# Patient Record
Sex: Male | Born: 1980 | Race: White | Hispanic: Yes | Marital: Married | Smoking: Never smoker
Health system: Southern US, Community
[De-identification: ages and names within clinical notes are randomized; demographics above are authoritative.]

## PROBLEM LIST (undated history)

## (undated) ENCOUNTER — Ambulatory Visit (HOSPITAL_COMMUNITY): Admission: EM | Payer: Self-pay

## (undated) DIAGNOSIS — Z789 Other specified health status: Secondary | ICD-10-CM

## (undated) HISTORY — DX: Other specified health status: Z78.9

---

## 2007-11-13 ENCOUNTER — Emergency Department (HOSPITAL_COMMUNITY): Admission: EM | Admit: 2007-11-13 | Discharge: 2007-11-13 | Payer: Self-pay | Admitting: Emergency Medicine

## 2011-03-31 ENCOUNTER — Encounter (HOSPITAL_COMMUNITY): Payer: Self-pay

## 2011-03-31 ENCOUNTER — Emergency Department (HOSPITAL_COMMUNITY): Payer: No Typology Code available for payment source

## 2011-03-31 ENCOUNTER — Emergency Department (HOSPITAL_COMMUNITY)
Admission: EM | Admit: 2011-03-31 | Discharge: 2011-03-31 | Disposition: A | Discharge status: Home / Self Care | Payer: No Typology Code available for payment source | Attending: Emergency Medicine | Admitting: Emergency Medicine

## 2011-03-31 DIAGNOSIS — S139XXA Sprain of joints and ligaments of unspecified parts of neck, initial encounter: Secondary | ICD-10-CM | POA: Insufficient documentation

## 2011-03-31 DIAGNOSIS — S161XXA Strain of muscle, fascia and tendon at neck level, initial encounter: Secondary | ICD-10-CM

## 2011-03-31 DIAGNOSIS — M542 Cervicalgia: Secondary | ICD-10-CM | POA: Insufficient documentation

## 2011-03-31 MED ORDER — OXYCODONE-ACETAMINOPHEN 5-325 MG PO TABS: 1.0000 | ORAL_TABLET | ORAL | Status: AC | PRN

## 2011-03-31 MED ORDER — NAPROXEN 500 MG PO TABS: 500.0000 mg | ORAL_TABLET | Freq: Two times a day (BID) | ORAL | Status: AC

## 2011-03-31 NOTE — ED Notes (Signed)
Patient transported to CT 
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
## 2013-10-05 ENCOUNTER — Ambulatory Visit: Payer: Self-pay

## 2014-05-24 ENCOUNTER — Ambulatory Visit: Payer: Self-pay | Attending: Internal Medicine

## 2015-05-27 ENCOUNTER — Ambulatory Visit: Payer: Self-pay | Attending: Internal Medicine

## 2017-07-02 ENCOUNTER — Ambulatory Visit (INDEPENDENT_AMBULATORY_CARE_PROVIDER_SITE_OTHER): Payer: Self-pay

## 2017-07-02 ENCOUNTER — Encounter (HOSPITAL_COMMUNITY): Payer: Self-pay | Admitting: Emergency Medicine

## 2017-07-02 ENCOUNTER — Ambulatory Visit (HOSPITAL_COMMUNITY)
Admission: EM | Admit: 2017-07-02 | Discharge: 2017-07-02 | Disposition: A | Discharge status: Home / Self Care | Payer: Self-pay | Attending: Family Medicine | Admitting: Family Medicine

## 2017-07-02 DIAGNOSIS — B349 Viral infection, unspecified: Secondary | ICD-10-CM

## 2017-07-02 MED ORDER — PREDNISONE 20 MG PO TABS: 40.0000 mg | ORAL_TABLET | Freq: Every day | ORAL | 0 refills | Status: AC

## 2017-07-02 NOTE — ED Provider Notes (Signed)
  [facility]    CSN: [id number] Arrival date & time: 07/02/17  1745   Chief Complaint  Patient presents with  . Fever    Geneva A Mendez-Agustin here for URI complaints. Here with wife who helps interpret [place].   Duration: 3 days  Associated symptoms: HA, muscle aches, knee pain, fevers up to 101 F Denies: sinus congestion, sinus pain, rhinorrhea, itchy watery eyes, ear pain, ear drainage, sore throat, urinary complaints. wheezing, shortness of breath, GI s/s's, rashes Treatment to date: Tylenol, ibuprofen Sick contacts: No  ROS:  Const: +fevers HEENT: As noted in HPI Lungs: No SOB  No known medical problems.  Family History  Family history unknown: Yes   Takes no meds routinely.  No Known Allergies  BP 109/71   Pulse 99   Temp 98.8 F (37.1 C)   Resp 16   SpO2 100%  General: Awake, alert, appears stated age HEENT: AT, , ears patent b/l and TM's neg, nares patent w/o discharge, pharynx pink and without exudates, MMM Neck: No masses or asymmetry Heart: RRR Lungs: CTAB, no accessory muscle use Abd: BS+, soft, NT, ND, neg Murphy's and McBurney's MSK: No warmth over jts or erythema Skin: No rashes on exposed skin surfaces Psych: Age appropriate judgment and insight, normal mood and affect  Viral illness  Pred burst for myalgias and what sounds like synovitis, likely 2/2 viral illness.  CXR neg.  F/u prn. He is encouraged to find PCP.  Pt voiced understanding and agreement to the plan.    Sharlene DoryWendling, Tamarah Bhullar Paul, [place] 07/02/17 1943

## 2017-07-02 NOTE — ED Triage Notes (Signed)
Pt c/o fever since Wednesday night, with headaches, body aches, bilateral knee aches, denies injury. Ambulatory with steady gait.

## 2017-07-02 NOTE — Discharge Instructions (Signed)
OK to take Tylenol 1000 mg (2 extra strength tabs) or 975 mg (3 regular strength tabs) every 6 hours as needed.  Continue to push fluids, practice good hand hygiene, and cover your mouth if you cough.  If you start having increasing fevers, shaking or shortness of breath, seek immediate care.

## 2019-01-08 IMAGING — DX DG CHEST 2V
2 series · 2 of 2 positions shown · non-contrast
Comparison: None.

CLINICAL DATA: Fever

EXAM:
CHEST - 2 VIEW

[chest pa]
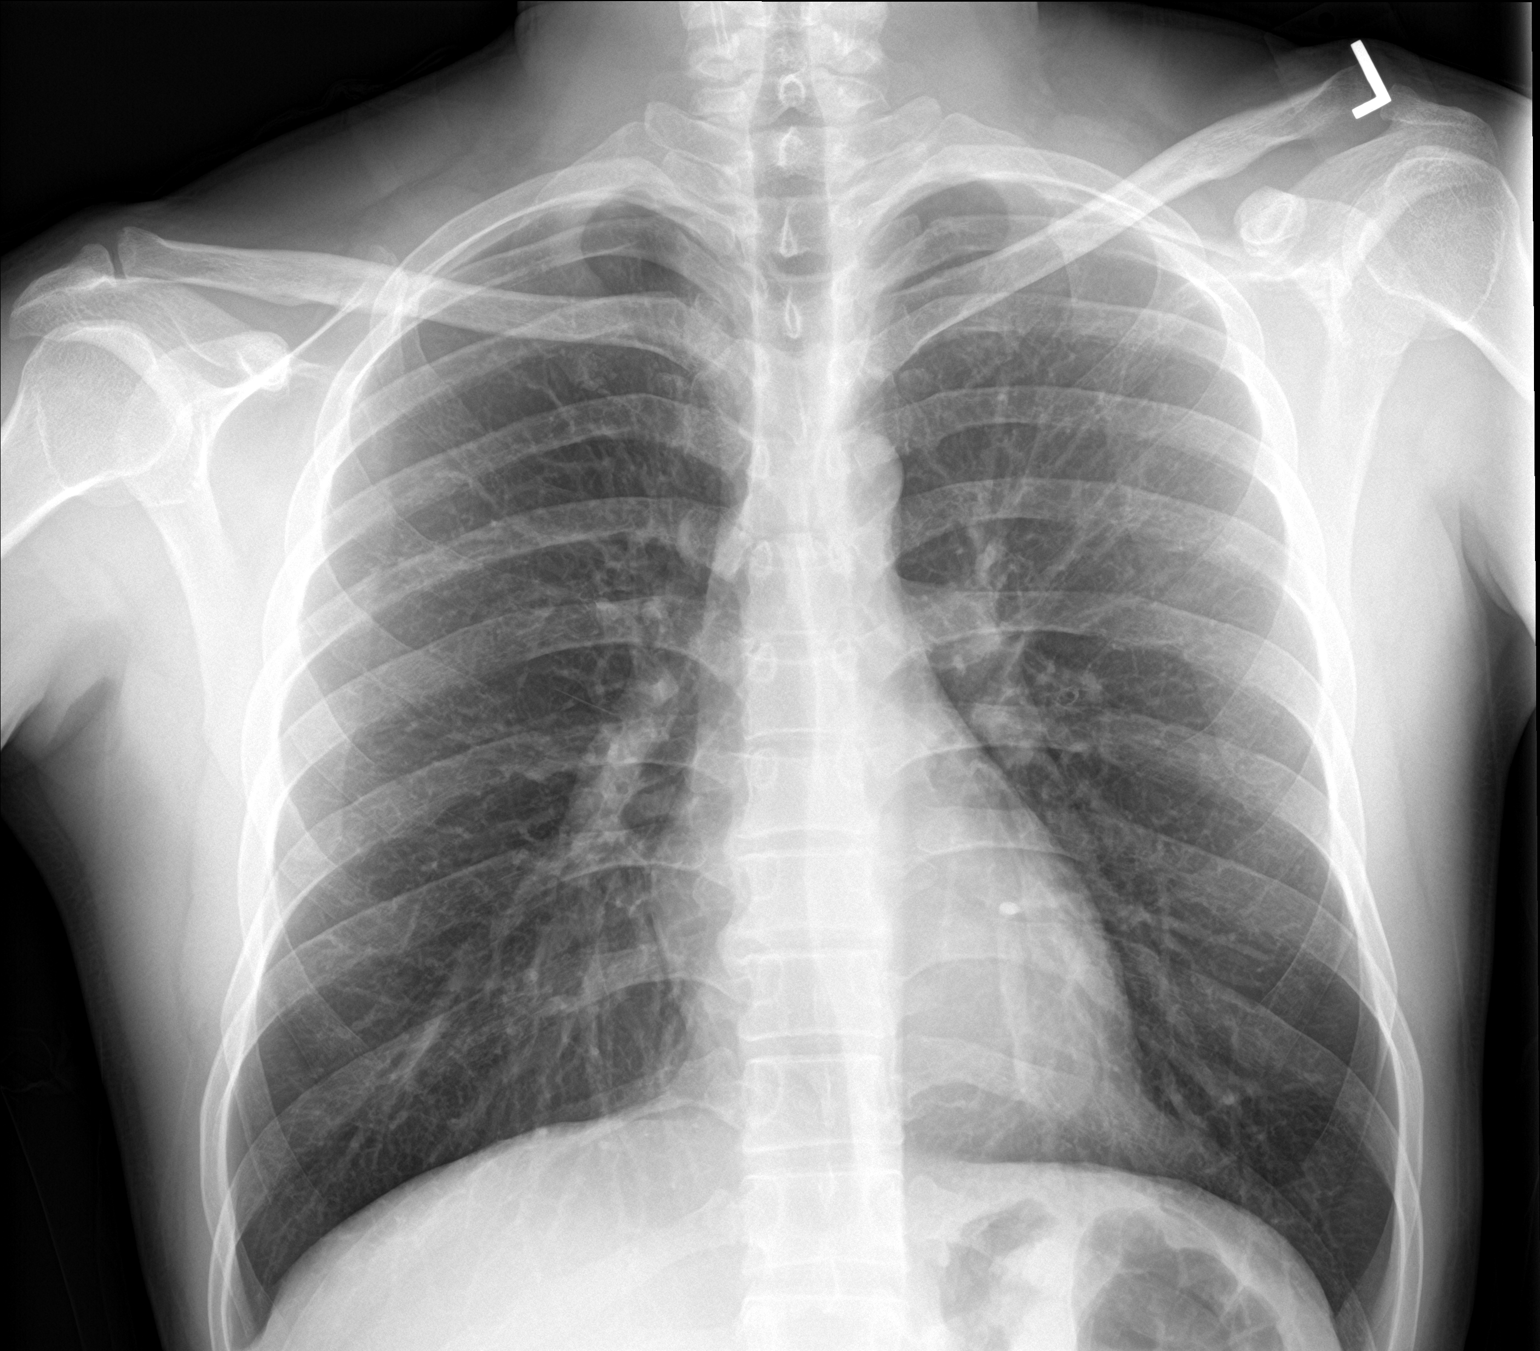

[chest lat]
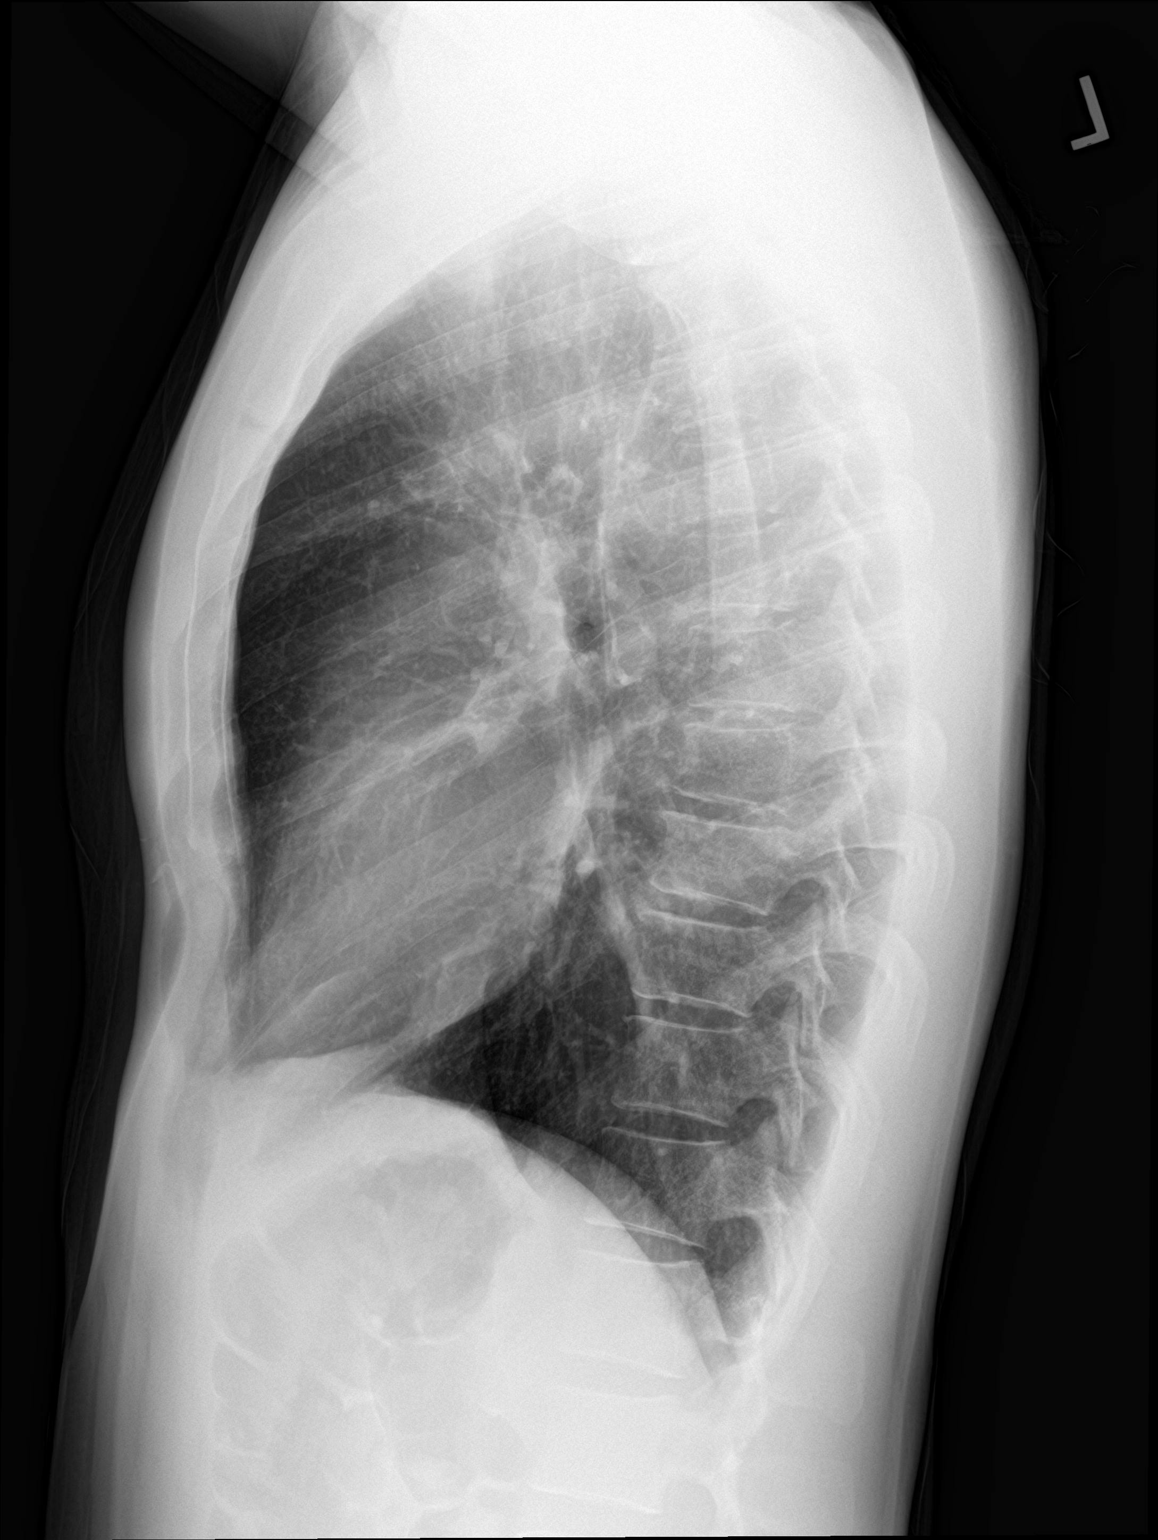

[2 of 2 positions shown; findings below may reference images not displayed]

FINDINGS: Heart and mediastinal contours are within normal limits. No focal
opacities or effusions. No acute bony abnormality.
IMPRESSION: No active cardiopulmonary disease.

## 2020-06-27 ENCOUNTER — Other Ambulatory Visit: Payer: Self-pay

## 2020-06-27 ENCOUNTER — Encounter (HOSPITAL_COMMUNITY): Payer: Self-pay

## 2020-06-27 ENCOUNTER — Ambulatory Visit (HOSPITAL_COMMUNITY)
Admission: EM | Admit: 2020-06-27 | Discharge: 2020-06-27 | Disposition: A | Discharge status: Home / Self Care | Payer: Self-pay | Attending: Physician Assistant | Admitting: Physician Assistant

## 2020-06-27 DIAGNOSIS — R059 Cough, unspecified: Secondary | ICD-10-CM

## 2020-06-27 DIAGNOSIS — J302 Other seasonal allergic rhinitis: Secondary | ICD-10-CM

## 2020-06-27 MED ORDER — ALBUTEROL SULFATE HFA 108 (90 BASE) MCG/ACT IN AERS: 1.0000 | INHALATION_SPRAY | Freq: Four times a day (QID) | RESPIRATORY_TRACT | 0 refills | Status: AC | PRN

## 2020-06-27 NOTE — Discharge Instructions (Addendum)
Recommend daily allergy medication like Zyrtec or Claritin Recommend Flonase Use albuterol inhaler as needed.

## 2020-06-27 NOTE — ED Provider Notes (Signed)
[facility]    CSN: [id number] Arrival date & time: 06/27/20  0818      History   Chief Complaint Chief Complaint  Patient presents with  . Nasal Congestion    HPI Eugene Washington is a 40 y.o. male.   Pt complains of nasal congestion that started yesterday.  He reports chest tightness with deep breathing last night, mild intermittent non productive cough.  He has a h/o seasonal allergies with similar episodes 1-2 times per year.  He took nyquil last night with some improvement.  He has used albuterol inhaler in the past with improvement.  Denies fever, chills, n/v/d, shortness of breath, wheezing, body aches.  He has completed his COVID vaccine.      History reviewed. No pertinent past medical history.  There are no problems to display for this patient.   History reviewed. No pertinent surgical history.     Home Medications    Prior to Admission medications   Not on File    Family History Family History  Family history unknown: Yes    Social History Social History   Tobacco Use  . Smoking status: Never Smoker  . Smokeless tobacco: Never Used  Substance Use Topics  . Alcohol use: Yes  . Drug use: No     Allergies   Patient has no known allergies.   Review of Systems Review of Systems  Constitutional: Negative for chills and fever.  HENT: Positive for congestion. Negative for ear pain and sore throat.   Eyes: Negative for pain and visual disturbance.  Respiratory: Positive for cough. Negative for shortness of breath and wheezing.   Cardiovascular: Negative for chest pain and palpitations.  Gastrointestinal: Negative for abdominal pain and vomiting.  Genitourinary: Negative for dysuria and hematuria.  Musculoskeletal: Negative for arthralgias and back pain.  Skin: Negative for color change and rash.  Neurological: Negative for seizures and syncope.  All other systems reviewed and are negative.    Physical Exam Triage Vital  Signs ED Triage Vitals  Enc Vitals Group     BP 06/27/20 0859 137/86     Pulse Rate 06/27/20 0859 77     Resp 06/27/20 0859 18     Temp 06/27/20 0859 97.8 F (36.6 C)     Temp Source 06/27/20 0859 Oral     SpO2 06/27/20 0859 97 %     Weight --      Height --      Head Circumference --      Peak Flow --      Pain Score 06/27/20 0858 0     Pain Loc --      Pain Edu? --      Excl. in GC? --    No data found.  Updated Vital Signs BP 137/86 (BP Location: Right Arm)   Pulse 77   Temp 97.8 F (36.6 C) (Oral)   Resp 18   SpO2 97%   Visual Acuity Right Eye Distance:   Left Eye Distance:   Bilateral Distance:    Right Eye Near:   Left Eye Near:    Bilateral Near:     Physical Exam Vitals and nursing note reviewed.  Constitutional:      Appearance: He is well-developed.  HENT:     Head: Normocephalic and atraumatic.  Eyes:     Conjunctiva/sclera: Conjunctivae normal.  Cardiovascular:     Rate and Rhythm: Normal rate and regular rhythm.     Heart sounds: No murmur  heard.   Pulmonary:     Effort: Pulmonary effort is normal. No respiratory distress.     Breath sounds: Normal breath sounds.  Abdominal:     Palpations: Abdomen is soft.     Tenderness: There is no abdominal tenderness.  Musculoskeletal:     Cervical back: Neck supple.  Skin:    General: Skin is warm and dry.  Neurological:     Mental Status: He is alert.      UC Treatments / Results  Labs (all labs ordered are listed, but only abnormal results are displayed) Labs Reviewed - No data to display  EKG   Radiology No results found.  Procedures Procedures (including critical care time)  Medications Ordered in UC Medications - No data to display  Initial Impression / Assessment and Plan / UC Course  I have reviewed the triage vital signs and the nursing notes.  Pertinent labs & imaging results that were available during my care of the patient were reviewed by me and considered in my  medical decision making (see chart for details).     Seasonal allergies/URI. Advised daily allergy medication like Zyrtec or Claritin.  Recommend Flonase.  Will prescribe albuterol inhaler.  Final Clinical Impressions(s) / UC Diagnoses   Final diagnoses:  None   Discharge Instructions   None    ED Prescriptions    None     PDMP not reviewed this encounter.   Jodell Cipro, PA-C 06/27/20 [phone]

## 2020-06-27 NOTE — ED Triage Notes (Signed)
Pt presents with nasal congestion x 1 day. Pt reports chest congestion when breathing. States around this time of the year he feels has the same congestion.

## 2022-09-20 ENCOUNTER — Emergency Department (HOSPITAL_COMMUNITY)
Admission: EM | Admit: 2022-09-20 | Discharge: 2022-09-21 | Disposition: A | Discharge status: Home / Self Care | Payer: Self-pay | Attending: Emergency Medicine | Admitting: Emergency Medicine

## 2022-09-20 ENCOUNTER — Other Ambulatory Visit: Payer: Self-pay

## 2022-09-20 ENCOUNTER — Emergency Department (HOSPITAL_COMMUNITY): Payer: Self-pay

## 2022-09-20 ENCOUNTER — Encounter (HOSPITAL_COMMUNITY): Payer: Self-pay

## 2022-09-20 DIAGNOSIS — W228XXA Striking against or struck by other objects, initial encounter: Secondary | ICD-10-CM | POA: Insufficient documentation

## 2022-09-20 DIAGNOSIS — Z23 Encounter for immunization: Secondary | ICD-10-CM | POA: Insufficient documentation

## 2022-09-20 DIAGNOSIS — S0990XA Unspecified injury of head, initial encounter: Secondary | ICD-10-CM

## 2022-09-20 DIAGNOSIS — S0101XA Laceration without foreign body of scalp, initial encounter: Secondary | ICD-10-CM | POA: Insufficient documentation

## 2022-09-20 MED ORDER — TETANUS-DIPHTH-ACELL PERTUSSIS 5-2.5-18.5 LF-MCG/0.5 IM SUSY
0.5000 mL | PREFILLED_SYRINGE | Freq: Once | INTRAMUSCULAR | Status: AC
  Administered 2022-09-21: 0.5 mL via INTRAMUSCULAR
  Filled 2022-09-20: qty 0.5
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
## 2022-09-21 MED ORDER — OXYCODONE-ACETAMINOPHEN 5-325 MG PO TABS
2.0000 | ORAL_TABLET | Freq: Once | ORAL | Status: AC
  Administered 2022-09-21: 2 via ORAL
  Filled 2022-09-21: qty 2

## 2022-09-21 MED ORDER — IBUPROFEN 800 MG PO TABS
800.0000 mg | ORAL_TABLET | Freq: Once | ORAL | Status: AC
  Administered 2022-09-21: 800 mg via ORAL
  Filled 2022-09-21: qty 1
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
## 2022-10-17 ENCOUNTER — Other Ambulatory Visit: Payer: Self-pay

## 2022-10-17 ENCOUNTER — Encounter (HOSPITAL_COMMUNITY): Payer: Self-pay

## 2022-10-17 ENCOUNTER — Emergency Department (HOSPITAL_COMMUNITY)
Admission: EM | Admit: 2022-10-17 | Discharge: 2022-10-17 | Disposition: A | Discharge status: Home / Self Care | Payer: Self-pay | Attending: Emergency Medicine | Admitting: Emergency Medicine

## 2022-10-17 DIAGNOSIS — Z4802 Encounter for removal of sutures: Secondary | ICD-10-CM | POA: Insufficient documentation

## 2022-10-17 NOTE — Discharge Instructions (Signed)
Please make sure the wound stays clean.  Follow-up as needed.  Asegrese de que la herida permanezca limpia.  Seguimiento segn sea necesario.  Comunquese con un mdico si: Tiene ms enrojecimiento, hinchazn o dolor alrededor de la herida. Observa lquido o sangre que salen de la herida. Experimenta calor, erupcin cutnea o dureza en el lugar de la herida que antes no se manifestaban. Observa pus o percibe mal olor que salen de la herida. La herida se abre. Solicite ayuda de inmediato si: Tiene fiebre o escalofros. Tiene lneas rojas que salen de la herida.

## 2022-10-17 NOTE — ED Triage Notes (Signed)
Patient here to have staples removed from scalp, in place greater than 3 weeks-no complaints

## 2022-10-17 NOTE — ED Provider Notes (Signed)
[place] EMERGENCY DEPARTMENT AT [facility] Provider Note   CSN: [id number] Arrival date & time: 10/17/22  1257     History No chief complaint on file.   Eugene Washington is a 42 y.o. male presents emerged from today for evaluation of staple removal.  Patient was seen earlier July and had a scalp laceration where he received 3 staples.  He has come in today to get these removed.  Denies any drainage from the area.  Denies any fevers, or swelling in the area.  No longer causing him any pain.  Has not had any acute complications since.  HPI     Home Medications Prior to Admission medications   Medication Sig Start Date End Date Taking? Authorizing Provider  albuterol (VENTOLIN HFA) 108 (90 Base) MCG/ACT inhaler Inhale 1-2 puffs into the lungs every 6 (six) hours as needed for wheezing or shortness of breath. 06/27/20   Ward, Tylene Fantasia, PA-C      Allergies    Patient has no known allergies.    Review of Systems   Review of Systems See HPI.  Physical Exam Updated Vital Signs BP 128/80   Pulse 75   Temp 98.2 F (36.8 C) (Oral)   Resp 16   SpO2 100%  Physical Exam Vitals and nursing note reviewed.  Constitutional:      Appearance: Normal appearance.  HENT:     Head:     Comments: 3 staples present on the more left side at the crown.  Pink wound present.  No discharge or fluctuance induration seen.  Nontender.  No increased overlying erythema or warmth. Eyes:     General: No scleral icterus. Pulmonary:     Effort: Pulmonary effort is normal. No respiratory distress.  Skin:    General: Skin is dry.     Findings: No rash.  Neurological:     General: No focal deficit present.     Mental Status: He is alert. Mental status is at baseline.  Psychiatric:        Mood and Affect: Mood normal.     ED Results / Procedures / Treatments   Labs (all labs ordered are listed, but only abnormal results are displayed) Labs Reviewed - No data to  display  EKG None  Radiology No results found.  Procedures .Suture Removal  Date/Time: 10/17/2022 1:25 PM  Performed by: Achille Rich, PA-C Authorized by: Achille Rich, PA-C   Consent:    Consent obtained:  Verbal   Consent given by:  Patient   Risks, benefits, and alternatives were discussed: yes     Risks discussed:  Pain and bleeding Universal protocol:    Procedure explained and questions answered to patient or proxy's satisfaction: yes     Patient identity confirmed:  Verbally with patient Location:    Location:  Head/neck   Head/neck location:  Scalp Procedure details:    Wound appearance:  No signs of infection, pink, nonpurulent, nontender and good wound healing   Number of staples removed:  3 Post-procedure details:    Post-removal:  No dressing applied   Procedure completion:  Tolerated well, no immediate complications    Medications Ordered in ED Medications - No data to display  ED Course/ Medical Decision Making/ A&P                              Medical Decision Making  Patient presenting for staple removal.  On  previous chart evaluation, patient was seen on 09-20-2022 and received 3 staples.  3 staples are present today.  Wound does not appear infected.  Pink.  No signs of infection.  No drainage noted.  Discussed with patient about getting these removed.  Likely will encounter some pain.  Patient agrees with procedure.  3 staples were removed.  No immediate complications.  After to the patient on keeping the area clean and following up as needed.  Staple removal information given in the discharge paperwork.  Return precautions given.  Patient stable for discharge home.  Final Clinical Impression(s) / ED Diagnoses Final diagnoses:  Encounter for staple removal    Rx / DC Orders ED Discharge Orders     None         Achille Rich, PA-C 10/17/22 1333    Terald Sleeper, MD 10/17/22 1354

## 2023-06-01 ENCOUNTER — Ambulatory Visit (INDEPENDENT_AMBULATORY_CARE_PROVIDER_SITE_OTHER): Payer: Self-pay | Admitting: Primary Care

## 2023-06-17 ENCOUNTER — Encounter (INDEPENDENT_AMBULATORY_CARE_PROVIDER_SITE_OTHER): Payer: Self-pay | Admitting: Primary Care

## 2023-06-17 ENCOUNTER — Other Ambulatory Visit: Payer: Self-pay

## 2023-06-17 ENCOUNTER — Ambulatory Visit (INDEPENDENT_AMBULATORY_CARE_PROVIDER_SITE_OTHER): Payer: Self-pay | Admitting: Primary Care

## 2023-06-17 VITALS — BP 129/80 | HR 80 | Temp 98.2°F | Ht 63.5 in | Wt 130.6 lb

## 2023-06-17 DIAGNOSIS — M25512 Pain in left shoulder: Secondary | ICD-10-CM

## 2023-06-17 DIAGNOSIS — Z1159 Encounter for screening for other viral diseases: Secondary | ICD-10-CM

## 2023-06-17 DIAGNOSIS — J302 Other seasonal allergic rhinitis: Secondary | ICD-10-CM

## 2023-06-17 DIAGNOSIS — G8929 Other chronic pain: Secondary | ICD-10-CM | POA: Insufficient documentation

## 2023-06-17 DIAGNOSIS — M25562 Pain in left knee: Secondary | ICD-10-CM

## 2023-06-17 DIAGNOSIS — M25511 Pain in right shoulder: Secondary | ICD-10-CM

## 2023-06-17 DIAGNOSIS — Z114 Encounter for screening for human immunodeficiency virus [HIV]: Secondary | ICD-10-CM

## 2023-06-17 DIAGNOSIS — M25561 Pain in right knee: Secondary | ICD-10-CM

## 2023-06-17 DIAGNOSIS — Z7689 Persons encountering health services in other specified circumstances: Secondary | ICD-10-CM

## 2023-06-17 MED ORDER — LORATADINE 10 MG PO TABS: 10.0000 mg | ORAL_TABLET | Freq: Every day | ORAL | 1 refills | Status: DC

## 2023-06-17 MED ORDER — FLUTICASONE PROPIONATE 50 MCG/ACT NA SUSP
2.0000 | Freq: Every day | NASAL | 6 refills | Status: AC
  Filled 2023-06-17: qty 16, 30d supply, fill #0

## 2023-06-17 MED ORDER — LORATADINE 10 MG PO TABS
10.0000 mg | ORAL_TABLET | Freq: Every day | ORAL | 1 refills | Status: AC
  Filled 2023-06-17: qty 60, 60d supply, fill #0

## 2023-06-17 MED ORDER — FLUTICASONE PROPIONATE 50 MCG/ACT NA SUSP: 2.0000 | Freq: Every day | NASAL | 6 refills | Status: DC

## 2023-06-17 NOTE — Progress Notes (Signed)
 New Patient Office Visit  Subjective    Patient ID: Eugene Washington male  DOB: 1980-09-28  Age: 43 y.o. MRN: [id number]   CC:  Bilateral shoulder blades not cervical spine HPI     New Patient (Initial Visit)    Additional comments: Upper back pain Knee pain Medication for allergies      Last edited by Ronette Deter, CMA on 06/17/2023  9:06 AM.      HPI  Eugene Washington is a 43 year old Hispanic male presenting to establish care.  Interpreter present [employer].  Patient voices concerns about pain bilateral shoulders pain level 7 out of 10 and pain bilateral knees.  10 out of 10.  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 Current Outpatient Medications on File Prior to Visit  Medication Sig Dispense Refill   albuterol (VENTOLIN HFA) 108 (90 Base) MCG/ACT inhaler Inhale 1-2 puffs into the lungs every 6 (six) hours as needed for wheezing or shortness of breath. (Patient not taking: Reported on 06/17/2023) 1 each 0   No current facility-administered medications on file prior to visit.     No Known Allergies  Past Medical History:  Diagnosis Date   No pertinent past medical history      Past Surgical History:  Procedure Laterality Date   no past surgery       Family History  Family history unknown: Yes    Social History   Socioeconomic History   Marital status: Married    Spouse name: Not on file   Number of children: Not on file   Years of education: Not on file   Highest education level: Not on file  Occupational History   Not on file  Tobacco Use   Smoking status: Never   Smokeless tobacco: Never  Substance and Sexual Activity   Alcohol use: Yes   Drug use: No   Sexual activity: Yes  Other Topics Concern   Not on file  Social History Narrative   Not on file   Social Drivers of Health   Financial Resource Strain: Not on file  Food Insecurity: Not on file  Transportation Needs: Not  on file  Physical Activity: Not on file  Stress: Not on file  Social Connections: Not on file  Intimate Partner Violence: Not on file       Health Maintenance  Topic Date Due   HIV Screening  Never done   Hepatitis C Screening  Never done   COVID-19 Vaccine (1 - 2024-25 season) Never done   Flu Shot  10/14/2023   DTaP/Tdap/Td vaccine (2 - Td or Tdap) 09/20/2032   HPV Vaccine  Aged Out    Objective    BP 129/80   Pulse 80   Temp 98.2 F (36.8 C) (Oral)   Ht 5' 3.5" (1.613 m)   Wt 130 lb 9.6 oz (59.2 kg)   SpO2 97%   BMI 22.77 kg/m   Physical Exam Vitals reviewed.  Constitutional:      Appearance: He is normal weight.  HENT:     Head: Normocephalic.     Right Ear: Tympanic membrane and external ear normal.     Left Ear: Tympanic membrane and external ear normal.     Nose: Nose normal.  Eyes:     Extraocular Movements: Extraocular movements intact.     Pupils: Pupils are equal, round, and reactive to light.  Cardiovascular:     Rate and Rhythm: Normal rate  and regular rhythm.  Pulmonary:     Effort: Pulmonary effort is normal.     Breath sounds: Normal breath sounds.  Abdominal:     General: Bowel sounds are normal. There is distension.     Palpations: Abdomen is soft.  Musculoskeletal:        General: Normal range of motion.     Cervical back: Normal range of motion.  Skin:    General: Skin is warm and dry.  Neurological:     Mental Status: He is oriented to person, place, and time.  Psychiatric:        Mood and Affect: Mood normal.        Behavior: Behavior normal.        Thought Content: Thought content normal.        Judgment: Judgment normal.     ibuprofen for aches and pains in the body but I do not have   Assessment & Plan:  [place] was seen today for new patient (initial visit).  Diagnoses and all orders for this visit:  Encounter to establish care -     CBC with Differential -     CMP14+EGFR -     Lipid Panel  Pain of both shoulder  joints -     Ambulatory referral to Orthopedic Surgery -     Vitamin D, 25-hydroxy  Chronic pain of both knees -     Ambulatory referral to Orthopedic Surgery -     Vitamin D, 25-hydroxy  Seasonal allergies -     fluticasone (FLONASE) 50 MCG/ACT nasal spray; Place 2 sprays into both nostrils daily. -     loratadine (CLARITIN) 10 MG tablet; Take 1 tablet (10 mg total) by mouth daily.  Other orders -     Discontinue: fluticasone (FLONASE) 50 MCG/ACT nasal spray; Place 2 sprays into both nostrils daily. -     Discontinue: loratadine (CLARITIN) 10 MG tablet; Take 1 tablet (10 mg total) by mouth daily. [place] was seen today for new patient (initial visit).  Diagnoses and all orders for this visit:  Encounter for screening for HIV -     HIV Antibody (routine testing w rflx)  Encounter for HCV screening test for low risk patient -     HCV Ab w Reflex to Quant PCR  Other orders -     Discontinue: fluticasone (FLONASE) 50 MCG/ACT nasal spray; Place 2 sprays into both nostrils daily. -     Discontinue: loratadine (CLARITIN) 10 MG tablet; Take 1 tablet (10 mg total) by mouth daily.     Follow-up:  Return for annual physical.  The above assessment and management plan was discussed with the patient. The patient verbalized understanding of and has agreed to the management plan. Patient is aware to call the clinic if symptoms fail to improve or worsen. Patient is aware when to return to the clinic for a follow-up visit. Patient educated on when it is appropriate to go to the emergency department.   Gwinda Passe, NP-C

## 2023-06-18 LAB — CMP14+EGFR
ALT: 31 IU/L (ref 0–44)
AST: 32 IU/L (ref 0–40)
Albumin: 4.7 g/dL (ref 4.1–5.1)
Alkaline Phosphatase: 106 IU/L (ref 44–121)
BUN/Creatinine Ratio: 16 (ref 9–20)
BUN: 12 mg/dL (ref 6–24)
Bilirubin Total: 0.4 mg/dL (ref 0.0–1.2)
CO2: 24 mmol/L (ref 20–29)
Calcium: 9.6 mg/dL (ref 8.7–10.2)
Chloride: 103 mmol/L (ref 96–106)
Creatinine, Ser: 0.74 mg/dL — ABNORMAL LOW (ref 0.76–1.27)
Globulin, Total: 2.8 g/dL (ref 1.5–4.5)
Glucose: 102 mg/dL — ABNORMAL HIGH (ref 70–99)
Potassium: 4.2 mmol/L (ref 3.5–5.2)
Sodium: 141 mmol/L (ref 134–144)
Total Protein: 7.5 g/dL (ref 6.0–8.5)
eGFR: 117 mL/min/{1.73_m2} (ref >59)

## 2023-06-18 LAB — CBC WITH DIFFERENTIAL/PLATELET
Basophils Absolute: 0.1 10*3/uL (ref 0.0–0.2)
Basos: 1 %
EOS (ABSOLUTE): 1.1 10*3/uL — ABNORMAL HIGH (ref 0.0–0.4)
Eos: 13 %
Hematocrit: 47 % (ref 37.5–51.0)
Hemoglobin: 15.9 g/dL (ref 13.0–17.7)
Immature Grans (Abs): 0 10*3/uL (ref 0.0–0.1)
Immature Granulocytes: 0 %
Lymphocytes Absolute: 1.8 10*3/uL (ref 0.7–3.1)
Lymphs: 19 %
MCH: 29.2 pg (ref 26.6–33.0)
MCHC: 33.8 g/dL (ref 31.5–35.7)
MCV: 86 fL (ref 79–97)
Monocytes Absolute: 0.7 10*3/uL (ref 0.1–0.9)
Monocytes: 7 %
Neutrophils Absolute: 5.4 10*3/uL (ref 1.4–7.0)
Neutrophils: 60 %
Platelets: 235 10*3/uL (ref 150–450)
RBC: 5.45 x10E6/uL (ref 4.14–5.80)
RDW: 13.7 % (ref 11.6–15.4)
WBC: 9.1 10*3/uL (ref 3.4–10.8)

## 2023-06-18 LAB — LIPID PANEL
Chol/HDL Ratio: 4.4 ratio (ref 0.0–5.0)
Cholesterol, Total: 210 mg/dL — ABNORMAL HIGH (ref 100–199)
HDL: 48 mg/dL (ref >39)
LDL Chol Calc (NIH): 143 mg/dL — ABNORMAL HIGH (ref 0–99)
Triglycerides: 105 mg/dL (ref 0–149)
VLDL Cholesterol Cal: 19 mg/dL (ref 5–40)

## 2023-06-18 LAB — HIV ANTIBODY (ROUTINE TESTING W REFLEX): HIV Screen 4th Generation wRfx: NONREACTIVE

## 2023-06-18 LAB — VITAMIN D 25 HYDROXY (VIT D DEFICIENCY, FRACTURES): Vit D, 25-Hydroxy: 35.4 ng/mL (ref 30.0–100.0)

## 2023-06-20 ENCOUNTER — Other Ambulatory Visit: Payer: Self-pay

## 2023-06-22 LAB — HCV INTERPRETATION

## 2023-06-22 LAB — HCV AB W REFLEX TO QUANT PCR: HCV Ab: NONREACTIVE

## 2023-07-01 ENCOUNTER — Other Ambulatory Visit: Payer: Self-pay

## 2023-07-01 ENCOUNTER — Other Ambulatory Visit (INDEPENDENT_AMBULATORY_CARE_PROVIDER_SITE_OTHER): Payer: Self-pay | Admitting: Primary Care

## 2023-07-01 ENCOUNTER — Other Ambulatory Visit (INDEPENDENT_AMBULATORY_CARE_PROVIDER_SITE_OTHER): Payer: Self-pay

## 2023-07-01 MED ORDER — ATORVASTATIN CALCIUM 20 MG PO TABS
20.0000 mg | ORAL_TABLET | Freq: Every day | ORAL | 1 refills | Status: AC
  Filled 2023-07-01: qty 90, 90d supply, fill #0

## 2023-07-01 NOTE — Telephone Encounter (Signed)
 Requested medication (s) are due for refill today - expired Rx  Requested medication (s) are on the active medication list -yes  Future visit scheduled -yes  Last refill: 06/27/20 1 each  Notes to clinic: Patient states ne does need inhaler- outside provider, expired Rx  Requested Prescriptions  Pending Prescriptions Disp Refills   albuterol  (VENTOLIN  HFA) 108 (90 Base) MCG/ACT inhaler 1 each 0    Sig: Inhale 1-2 puffs into the lungs every 6 (six) hours as needed for wheezing or shortness of breath.     Pulmonology:  Beta Agonists 2 Passed - 07/01/2023  4:27 PM      Passed - Last BP in normal range    BP Readings from Last 1 Encounters:  06/17/23 129/80         Passed - Last Heart Rate in normal range    Pulse Readings from Last 1 Encounters:  06/17/23 80         Passed - Valid encounter within last 12 months    Recent Outpatient Visits           2 weeks ago Encounter to establish care   [facility] Marius Siemens, NP       Future Appointments             In 11 months Marius Siemens, NP [place] [facility]               Requested Prescriptions  Pending Prescriptions Disp Refills   albuterol  (VENTOLIN  HFA) 108 (90 Base) MCG/ACT inhaler 1 each 0    Sig: Inhale 1-2 puffs into the lungs every 6 (six) hours as needed for wheezing or shortness of breath.     Pulmonology:  Beta Agonists 2 Passed - 07/01/2023  4:27 PM      Passed - Last BP in normal range    BP Readings from Last 1 Encounters:  06/17/23 129/80         Passed - Last Heart Rate in normal range    Pulse Readings from Last 1 Encounters:  06/17/23 80         Passed - Valid encounter within last 12 months    Recent Outpatient Visits           2 weeks ago Encounter to establish care   [place] [facility] Marius Siemens, NP       Future Appointments             In 11 months Denece Finger, Meade Spencer, NP [place]  [facility]

## 2023-07-01 NOTE — Telephone Encounter (Signed)
 Copied from CRM [id number]. Topic: Clinical - Medication Refill >> Jul 01, 2023  1:38 PM Rennis Case wrote: Most Recent Primary Care Visit:  Provider: Marius Siemens  Department: [facility]  Visit Type: NEW PATIENT  Date: 06/17/2023  Medication: albuterol  (VENTOLIN  HFA) 108 (90 Base) MCG/ACT inhaler Wife calling requesting rx for patient. States patient may have misunderstood question about inhaler at appointment. Patient needing inhaler to be sent to pharmacy below.   Has the patient contacted their pharmacy? No  Is this the correct pharmacy for this prescription? Yes This is the patient's preferred pharmacy:   [facility] [address] [place] [address] Phone: [phone] Fax: [phone]   Has the prescription been filled recently? No  Is the patient out of the medication? Yes  Has the patient been seen for an appointment in the last year OR does the patient have an upcoming appointment? Yes  Can we respond through MyChart? No  Agent: Please be advised that Rx refills may take up to 3 business days. We ask that you follow-up with your pharmacy.

## 2023-07-04 ENCOUNTER — Other Ambulatory Visit: Payer: Self-pay

## 2024-06-18 ENCOUNTER — Encounter (INDEPENDENT_AMBULATORY_CARE_PROVIDER_SITE_OTHER): Admitting: Primary Care
